# Patient Record
Sex: Female | Born: 2000
Health system: Southern US, Community
[De-identification: ages and names within clinical notes are randomized; demographics above are authoritative.]

## PROBLEM LIST (undated history)

## (undated) DIAGNOSIS — K2970 Gastritis, unspecified, without bleeding: Secondary | ICD-10-CM

---
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
## 2022-04-15 ENCOUNTER — Ambulatory Visit: Payer: Self-pay

## 2022-04-16 ENCOUNTER — Telehealth: Payer: Medicaid Other | Admitting: Family Medicine

## 2022-04-16 DIAGNOSIS — J208 Acute bronchitis due to other specified organisms: Secondary | ICD-10-CM

## 2022-04-16 MED ORDER — ALBUTEROL SULFATE HFA 108 (90 BASE) MCG/ACT IN AERS: 1.0000 | INHALATION_SPRAY | Freq: Four times a day (QID) | RESPIRATORY_TRACT | 0 refills | Status: AC | PRN

## 2022-04-16 MED ORDER — PREDNISONE 20 MG PO TABS: 40.0000 mg | ORAL_TABLET | Freq: Every day | ORAL | 0 refills | Status: AC

## 2022-04-16 NOTE — Progress Notes (Signed)
Virtual Visit Consent   Stacey Cruz, you are scheduled for a virtual visit with a [facility] provider today. Just as with appointments in the office, your consent must be obtained to participate. Your consent will be active for this visit and any virtual visit you may have with one of our providers in the next 365 days. If you have a MyChart account, a copy of this consent can be sent to you electronically.  As this is a virtual visit, video technology does not allow for your provider to perform a traditional examination. This may limit your provider's ability to fully assess your condition. If your provider identifies any concerns that need to be evaluated in person or the need to arrange testing (such as labs, EKG, etc.), we will make arrangements to do so. Although advances in technology are sophisticated, we cannot ensure that it will always work on either your end or our end. If the connection with a video visit is poor, the visit may have to be switched to a telephone visit. With either a video or telephone visit, we are not always able to ensure that we have a secure connection.  By engaging in this virtual visit, you consent to the provision of healthcare and authorize for your insurance to be billed (if applicable) for the services provided during this visit. Depending on your insurance coverage, you may receive a charge related to this service.  I need to obtain your verbal consent now. Are you willing to proceed with your visit today? Stacey Cruz has provided verbal consent on 04/16/2022 for a virtual visit (video or telephone). Stacey Mayo, NP  Date: 04/16/2022 10:49 AM  Virtual Visit via Video Note   I, Stacey Cruz, connected with  Stacey Cruz  ([id number], 2000/12/27) on 04/16/22 at 10:45 AM EDT by a video-enabled telemedicine application and verified that I am speaking with the correct person using two identifiers.  Location: Patient: Virtual Visit Location Patient:  Home Provider: Virtual Visit Location Provider: Home Office   I discussed the limitations of evaluation and management by telemedicine and the availability of in person appointments. The patient expressed understanding and agreed to proceed.    History of Present Illness: Stacey Cruz is a 21 y.o. who identifies as a female who was assigned female at birth, and is being seen today for cough and congestion. Onset was last Friday with cough- causing some chest discomfort, mild sore throat that is more related to cough now. Denies fevers, chills, and ear pain.   Shortness of breath when exerting and excessive talking. No pain when taking deep breath.   Tested negative for covid on Sunday. Denies sick contacts. Works at [employer] (new job, might be allergy issue with pets- takes Allergy daily).    No history of asthma, remote history in past around age 25 that required inhaler of illness.   Covid vaccines, no boosters as of yet Has not gotten flu vaccine  Problems: There are no problems to display for this patient.   Allergies: Not on File Medications: No current outpatient medications on file.  Observations/Objective: Patient is well-developed, well-nourished in no acute distress.  Resting comfortably  at home.  Head is normocephalic, atraumatic.  No labored breathing.  Speech is clear and coherent with logical content.  Patient is alert and oriented at baseline.  Cough   Assessment and Plan: 1. Viral bronchitis  - albuterol (VENTOLIN HFA) 108 (90 Base) MCG/ACT inhaler; Inhale 1-2 puffs into the lungs every  6 (six) hours as needed for wheezing or shortness of breath.  Dispense: 8 g; Refill: 0 - predniSONE (DELTASONE) 20 MG tablet; Take 2 tablets (40 mg total) by mouth daily with breakfast for 3 days.  Dispense: 6 tablet; Refill: 0  -questionable allergies with new job vs viral  -will treat with above given the cough and shortness of breath- no red flags, but reviewed in person  precautions  -encouraged flonase and or change in allergy meds -work note   Reviewed side effects, risks and benefits of medication.    Patient acknowledged agreement and understanding of the plan.   Past Medical, Surgical, Social History, Allergies, and Medications have been Reviewed.    Follow Up Instructions: I discussed the assessment and treatment plan with the patient. The patient was provided an opportunity to ask questions and all were answered. The patient agreed with the plan and demonstrated an understanding of the instructions.  A copy of instructions were sent to the patient via MyChart unless otherwise noted below.    The patient was advised to call back or seek an in-person evaluation if the symptoms worsen or if the condition fails to improve as anticipated.  Time:  I spent 14 minutes with the patient via telehealth technology discussing the above problems/concerns.    Freddy Finner, NP

## 2022-04-16 NOTE — Patient Instructions (Signed)
Stacey Cruz, thank you for joining Freddy Finner, NP for today's virtual visit.  While this provider is not your primary care provider (PCP), if your PCP is located in our provider database this encounter information will be shared with them immediately following your visit.  Consent: (Patient) Stacey Cruz provided verbal consent for this virtual visit at the beginning of the encounter.  Current Medications:  Current Outpatient Medications:    albuterol (VENTOLIN HFA) 108 (90 Base) MCG/ACT inhaler, Inhale 1-2 puffs into the lungs every 6 (six) hours as needed for wheezing or shortness of breath., Disp: 8 g, Rfl: 0   predniSONE (DELTASONE) 20 MG tablet, Take 2 tablets (40 mg total) by mouth daily with breakfast for 3 days., Disp: 6 tablet, Rfl: 0   Medications ordered in this encounter:  Meds ordered this encounter  Medications   albuterol (VENTOLIN HFA) 108 (90 Base) MCG/ACT inhaler    Sig: Inhale 1-2 puffs into the lungs every 6 (six) hours as needed for wheezing or shortness of breath.    Dispense:  8 g    Refill:  0    Order Specific Question:   Supervising Provider    Answer:   Merrilee Jansky [[id number]]   predniSONE (DELTASONE) 20 MG tablet    Sig: Take 2 tablets (40 mg total) by mouth daily with breakfast for 3 days.    Dispense:  6 tablet    Refill:  0    Order Specific Question:   Supervising Provider    Answer:   Merrilee Jansky [id number]     *If you need refills on other medications prior to your next appointment, please contact your pharmacy*  Follow-Up: Call back or seek an in-person evaluation if the symptoms worsen or if the condition fails to improve as anticipated.  Other Instructions Acute Bronchitis, Adult  Acute bronchitis is when air tubes in the lungs (bronchi) suddenly get swollen. The condition can make it hard for you to breathe. In adults, acute bronchitis usually goes away within 2 weeks. A cough caused by bronchitis may last up to 3 weeks.  Smoking, allergies, and asthma can make the condition worse. What are the causes? Germs that cause cold and flu (viruses). The most common cause of this condition is the virus that causes the common cold. Bacteria. Substances that bother (irritate) the lungs, including: Smoke from cigarettes and other types of tobacco. Dust and pollen. Fumes from chemicals, gases, or burned fuel. Indoor or outdoor air pollution. What increases the risk? A weak body's defense system. This is also called the immune system. Any condition that affects your lungs and breathing, such as asthma. What are the signs or symptoms? A cough. Coughing up clear, yellow, or green mucus. Making high-pitched whistling sounds when you breathe, most often when you breathe out (wheezing). Runny or stuffy nose. Having too much mucus in your lungs (chest congestion). Shortness of breath. Body aches. A sore throat. How is this treated? Acute bronchitis may go away over time without treatment. Your doctor may tell you to: Drink more fluids. This will help thin your mucus so it is easier to cough up. Use a device that gets medicine into your lungs (inhaler). Use a vaporizer or a humidifier. These are machines that add water to the air. This helps with coughing and poor breathing. Take a medicine that thins mucus and helps clear it from your lungs. Take a medicine that prevents or stops coughing. It is not common to take an  antibiotic medicine for this condition. Follow these instructions at home:  Take over-the-counter and prescription medicines only as told by your doctor. Use an inhaler, vaporizer, or humidifier as told by your doctor. Take two teaspoons (10 mL) of honey at bedtime. This helps lessen your coughing at night. Drink enough fluid to keep your pee (urine) pale yellow. Do not smoke or use any products that contain nicotine or tobacco. If you need help quitting, ask your doctor. Get a lot of rest. Return to  your normal activities when your doctor says that it is safe. Keep all follow-up visits. How is this prevented?  Wash your hands often with soap and water for at least 20 seconds. If you cannot use soap and water, use hand sanitizer. Avoid contact with people who have cold symptoms. Try not to touch your mouth, nose, or eyes with your hands. Avoid breathing in smoke or chemical fumes. Make sure to get the flu shot every year. Contact a doctor if: Your symptoms do not get better in 2 weeks. You have trouble coughing up the mucus. Your cough keeps you awake at night. You have a fever. Get help right away if: You cough up blood. You have chest pain. You have very bad shortness of breath. You faint or keep feeling like you are going to faint. You have a very bad headache. Your fever or chills get worse. These symptoms may be an emergency. Get help right away. Call your local emergency services (911 in the [place] Do not wait to see if the symptoms will go away. Do not drive yourself to the hospital. Summary Acute bronchitis is when air tubes in the lungs (bronchi) suddenly get swollen. In adults, acute bronchitis usually goes away within 2 weeks. Drink more fluids. This will help thin your mucus so it is easier to cough up. Take over-the-counter and prescription medicines only as told by your doctor. Contact a doctor if your symptoms do not improve after 2 weeks of treatment. This information is not intended to replace advice given to you by your health care provider. Make sure you discuss any questions you have with your health care provider. Document Revised: 11/13/2020 Document Reviewed: 11/13/2020 Elsevier Patient Education  [place] [facility].    If you have been instructed to have an in-person evaluation today at a local Urgent Care facility, please use the link below. It will take you to a list of all of our available [facility], including address, phone number and  hours of operation. Please do not delay care.  [facility]  If you or a family member do not have a primary care provider, use the link below to schedule a visit and establish care. When you choose a [facility] primary care physician or advanced practice provider, you gain a long-term partner in health. Find a Primary Care Provider  Learn more about [facility] in-office and virtual care options: [facility]

## 2023-12-02 ENCOUNTER — Emergency Department (HOSPITAL_COMMUNITY)
Admission: EM | Admit: 2023-12-02 | Discharge: 2023-12-02 | Disposition: A | Discharge status: Home / Self Care | Attending: Emergency Medicine | Admitting: Emergency Medicine

## 2023-12-02 ENCOUNTER — Other Ambulatory Visit: Payer: Self-pay

## 2023-12-02 DIAGNOSIS — R110 Nausea: Secondary | ICD-10-CM | POA: Diagnosis present

## 2023-12-02 DIAGNOSIS — N3000 Acute cystitis without hematuria: Secondary | ICD-10-CM | POA: Diagnosis not present

## 2023-12-02 LAB — URINALYSIS, ROUTINE W REFLEX MICROSCOPIC
Bilirubin Urine: NEGATIVE
Glucose, UA: NEGATIVE mg/dL
Ketones, ur: NEGATIVE mg/dL
Nitrite: POSITIVE — AB
Protein, ur: NEGATIVE mg/dL
RBC / HPF: >50 RBC/hpf (ref 0–5)
Specific Gravity, Urine: 1.018 (ref 1.005–1.030)
pH: 5 (ref 5.0–8.0)

## 2023-12-02 LAB — CBC WITH DIFFERENTIAL/PLATELET
Abs Immature Granulocytes: 0.01 10*3/uL (ref 0.00–0.07)
Basophils Absolute: 0 10*3/uL (ref 0.0–0.1)
Basophils Relative: 0 %
Eosinophils Absolute: 0.1 10*3/uL (ref 0.0–0.5)
Eosinophils Relative: 2 %
HCT: 41.8 % (ref 36.0–46.0)
Hemoglobin: 13.7 g/dL (ref 12.0–15.0)
Immature Granulocytes: 0 %
Lymphocytes Relative: 35 %
Lymphs Abs: 2.5 10*3/uL (ref 0.7–4.0)
MCH: 29.8 pg (ref 26.0–34.0)
MCHC: 32.8 g/dL (ref 30.0–36.0)
MCV: 91.1 fL (ref 80.0–100.0)
Monocytes Absolute: 0.7 10*3/uL (ref 0.1–1.0)
Monocytes Relative: 10 %
Neutro Abs: 3.8 10*3/uL (ref 1.7–7.7)
Neutrophils Relative %: 53 %
Platelets: 257 10*3/uL (ref 150–400)
RBC: 4.59 MIL/uL (ref 3.87–5.11)
RDW: 12.1 % (ref 11.5–15.5)
WBC: 7.1 10*3/uL (ref 4.0–10.5)
nRBC: 0 % (ref 0.0–0.2)

## 2023-12-02 LAB — COMPREHENSIVE METABOLIC PANEL WITH GFR
ALT: 21 U/L (ref 0–44)
AST: 18 U/L (ref 15–41)
Albumin: 4.4 g/dL (ref 3.5–5.0)
Alkaline Phosphatase: 53 U/L (ref 38–126)
Anion gap: 9 (ref 5–15)
BUN: 10 mg/dL (ref 6–20)
CO2: 24 mmol/L (ref 22–32)
Calcium: 9.4 mg/dL (ref 8.9–10.3)
Chloride: 106 mmol/L (ref 98–111)
Creatinine, Ser: 0.76 mg/dL (ref 0.44–1.00)
GFR, Estimated: >60 mL/min (ref >60)
Glucose, Bld: 110 mg/dL — ABNORMAL HIGH (ref 70–99)
Potassium: 3.7 mmol/L (ref 3.5–5.1)
Sodium: 139 mmol/L (ref 135–145)
Total Bilirubin: 0.6 mg/dL (ref 0.0–1.2)
Total Protein: 8.4 g/dL — ABNORMAL HIGH (ref 6.5–8.1)

## 2023-12-02 LAB — PREGNANCY, URINE: Preg Test, Ur: NEGATIVE

## 2023-12-02 LAB — LIPASE, BLOOD: Lipase: 30 U/L (ref 11–51)

## 2023-12-02 MED ORDER — SODIUM CHLORIDE 0.9 % IV SOLN
1.0000 g | Freq: Once | INTRAVENOUS | Status: AC
  Administered 2023-12-02: 1 g via INTRAVENOUS
  Filled 2023-12-02: qty 10

## 2023-12-02 MED ORDER — ONDANSETRON HCL 4 MG/2ML IJ SOLN
4.0000 mg | Freq: Once | INTRAMUSCULAR | Status: AC
  Administered 2023-12-02: 4 mg via INTRAVENOUS
  Filled 2023-12-02: qty 2

## 2023-12-02 MED ORDER — CEPHALEXIN 500 MG PO CAPS: 500.0000 mg | ORAL_CAPSULE | Freq: Two times a day (BID) | ORAL | 0 refills | Status: AC

## 2023-12-02 MED ORDER — ONDANSETRON 4 MG PO TBDP: 4.0000 mg | ORAL_TABLET | Freq: Three times a day (TID) | ORAL | 0 refills | Status: AC | PRN

## 2023-12-02 MED ORDER — LACTATED RINGERS IV BOLUS
1000.0000 mL | Freq: Once | INTRAVENOUS | Status: AC
  Administered 2023-12-02: 1000 mL via INTRAVENOUS

## 2023-12-02 NOTE — ED Provider Notes (Signed)
 [facility] Emergency Department Provider Note MRN:  [id number]  Arrival date & time: 12/02/23     Chief Complaint   Nausea   History of Present Illness   Stacey Cruz is a 23 y.o. year-old female presents to the ED with chief complaint of nausea.  She states that she has been having symptoms for about the past 2 weeks.  She states that she does have some occasional vomiting.  She denies fevers or chills.  Denies any diarrhea.  Denies any abdominal pain, chest pain, shortness of breath, dysuria, hematuria, unusual vaginal discharge or bleeding.  She states that she is a little late on her menstrual cycle.  She denies any other associated symptoms.  Denies any successful treatments prior to arrival..  History provided by patient.   Review of Systems  Pertinent positive and negative review of systems noted in HPI.    Physical Exam   Vitals:   12/02/23 0112  BP: (!) 152/93  Pulse: 73  Resp: (!) 21  Temp: 97.7 F (36.5 C)  SpO2: 100%    CONSTITUTIONAL:  non toxic-appearing, NAD NEURO:  Alert and oriented x 3, CN 3-12 grossly intact EYES:  eyes equal and reactive ENT/NECK:  Supple, no stridor  CARDIO:  normal rate, regular rhythm, appears well-perfused  PULM:  No respiratory distress, CTAB GI/GU:  non-distended, no focal abdominal tenderness MSK/SPINE:  No gross deformities, no edema, moves all extremities  SKIN:  no rash, atraumatic   *Additional and/or pertinent findings included in MDM below  Diagnostic and Interventional Summary    EKG Interpretation Date/Time:    Ventricular Rate:    PR Interval:    QRS Duration:    QT Interval:    QTC Calculation:   R Axis:      Text Interpretation:         Labs Reviewed  COMPREHENSIVE METABOLIC PANEL WITH GFR - Abnormal; Notable for the following components:      Result Value   Glucose, Bld 110 (*)    Total Protein 8.4 (*)    All other components within normal limits  URINALYSIS, ROUTINE W  REFLEX MICROSCOPIC - Abnormal; Notable for the following components:   APPearance HAZY (*)    Hgb urine dipstick MODERATE (*)    Nitrite POSITIVE (*)    Leukocytes,Ua SMALL (*)    Bacteria, UA MANY (*)    All other components within normal limits  PREGNANCY, URINE  LIPASE, BLOOD  CBC WITH DIFFERENTIAL/PLATELET    No orders to display    Medications  lactated ringers bolus 1,000 mL (0 mLs Intravenous Stopped 12/02/23 0323)  cefTRIAXone (ROCEPHIN) 1 g in sodium chloride  0.9 % 100 mL IVPB (0 g Intravenous Stopped 12/02/23 0235)  ondansetron  (ZOFRAN ) injection 4 mg (4 mg Intravenous Given 12/02/23 0155)     Procedures  /  Critical Care Procedures  ED Course and Medical Decision Making  I have reviewed the triage vital signs, the nursing notes, and pertinent available records from the EMR.  Social Determinants Affecting Complexity of Care: Patient has no clinically significant social determinants affecting this chief complaint..   ED Course: Clinical Course as of 12/02/23 0323  Thu Dec 02, 2023  0322 CBC with Diff No leukocytosis or anemia [RB]  0322 Lipase, blood , Lipase normal, doubt pancreatitis [RB]  0322 Comprehensive metabolic panel(!) No significant electrolyte abnormality [RB]  0322 Urinalysis, Routine w reflex microscopic -Urine, Clean Catch(!) Urinalysis is consistent with infection, I suspect that this is  the source of her associated nausea.  She is afebrile.  She does not have any back pain.  Doubt sepsis or pyelonephritis.  Treated with Rocephin IV in the ED.  Will discharge home on Keflex. [RB]  [id number] Pregnancy, urine Pregnancy test negative [RB]  0323 Patient feels improved after treatment in the ED.  She is tolerating p.o.  Return precautions discussed.  Patient understands and agrees with the plan. [RB]    Clinical Course User Index [RB] Sherel Dikes, PA-C    Medical Decision Making Amount and/or Complexity of Data Reviewed Labs:  ordered.  Risk Prescription drug management.         Consultants: No consultations were needed in caring for this patient.   Treatment and Plan: I considered admission due to patient's initial presentation, but after considering the examination and diagnostic results, patient will not require admission and can be discharged with outpatient follow-up.    Final Clinical Impressions(s) / ED Diagnoses     ICD-10-CM   1. Nausea  R11.0     2. Acute cystitis without hematuria  N30.00       ED Discharge Orders          Ordered    cephALEXin (KEFLEX) 500 MG capsule  2 times daily        12/02/23 0316    ondansetron  (ZOFRAN -ODT) 4 MG disintegrating tablet  Every 8 hours PRN        12/02/23 0316              Discharge Instructions Discussed with and Provided to Patient:   Discharge Instructions   None      Sherel Dikes, PA-C 12/02/23 [id number]    Palumbo, April, MD 12/02/23 0401

## 2023-12-02 NOTE — ED Notes (Signed)
Tolerating ginger ale at this time 

## 2023-12-02 NOTE — ED Triage Notes (Signed)
 Pt reports nausea x2 weeks with intermittent vomiting approx every other day. LMP "sometime last month, might be a little bit late."

## 2023-12-07 ENCOUNTER — Telehealth: Admitting: Family Medicine

## 2023-12-07 DIAGNOSIS — R110 Nausea: Secondary | ICD-10-CM

## 2023-12-07 NOTE — Progress Notes (Signed)
 Stacey Cruz- you should not be having nausea still if the UTI is improving. We need to have you checked again to make sure you are clearing it and it is not in your kidneys.  Please follow up in person at a local urgent care or your PCP if they have an opening today.      NOTE: There will be NO CHARGE for this E-Visit   If you are having a true medical emergency, please call 911.

## 2023-12-16 ENCOUNTER — Ambulatory Visit: Admitting: Nurse Practitioner
